# Patient Record
Sex: Female | Born: 1990 | Race: White | Hispanic: No | Marital: Single
Health system: Southern US, Community
[De-identification: ages and names within clinical notes are randomized; demographics above are authoritative.]

## PROBLEM LIST (undated history)

## (undated) DIAGNOSIS — F419 Anxiety disorder, unspecified: Secondary | ICD-10-CM

---

## 2020-03-28 ENCOUNTER — Emergency Department
Admission: EM | Admit: 2020-03-28 | Discharge: 2020-03-28 | Disposition: A | Discharge status: Home / Self Care | Payer: BC Managed Care – PPO | Source: Home / Self Care | Attending: Family Medicine | Admitting: Family Medicine

## 2020-03-28 ENCOUNTER — Other Ambulatory Visit: Payer: Self-pay

## 2020-03-28 ENCOUNTER — Emergency Department (INDEPENDENT_AMBULATORY_CARE_PROVIDER_SITE_OTHER): Payer: BC Managed Care – PPO

## 2020-03-28 ENCOUNTER — Encounter: Payer: Self-pay | Admitting: Emergency Medicine

## 2020-03-28 DIAGNOSIS — S93492A Sprain of other ligament of left ankle, initial encounter: Secondary | ICD-10-CM

## 2020-03-28 DIAGNOSIS — W19XXXA Unspecified fall, initial encounter: Secondary | ICD-10-CM

## 2020-03-28 DIAGNOSIS — M25572 Pain in left ankle and joints of left foot: Secondary | ICD-10-CM

## 2020-03-28 HISTORY — DX: Anxiety disorder, unspecified: F41.9

## 2020-03-28 NOTE — ED Provider Notes (Signed)
[facility]    CSN: [id number] Arrival date & time: 03/28/20  1310      History   Chief Complaint Chief Complaint  Patient presents with  . Ankle Injury    HPI Christina Molina is a 29 y.o. female.   While exercising in a gym yesterday, patient jumped over a low hurdle, twisting her left ankle.  She has had persistent pain and swelling in her lateral ankle.         The history is provided by the patient.  Ankle Injury This is a new problem. The current episode started yesterday. The problem occurs constantly. The problem has not changed since onset.The symptoms are aggravated by walking. Nothing relieves the symptoms. Treatments tried: Tylenol. The treatment provided no relief.    Past Medical History:  Diagnosis Date  . Anxiety     There are no problems to display for this patient.   History reviewed. No pertinent surgical history.  OB History   No obstetric history on file.      Home Medications    Prior to Admission medications   Medication Sig Start Date End Date Taking? Authorizing Provider  EPINEPHrine 0.3 mg/0.3 mL IJ SOAJ injection Inject 0.3 mg into the muscle as needed for anaphylaxis.   Yes [provider]  venlafaxine (EFFEXOR) 75 MG tablet Take 75 mg by mouth 2 (two) times daily.   Yes [provider]    Family History No family history on file.  Social History Social History   Tobacco Use  . Smoking status: Not on file  Substance Use Topics  . Alcohol use: Not on file  . Drug use: Not on file     Allergies   Patient has no known allergies.   Review of Systems Review of Systems  Constitutional: Negative.   Musculoskeletal: Positive for joint swelling.       Left ankle pain     Physical Exam Triage Vital Signs ED Triage Vitals  Enc Vitals Group     BP 03/28/20 1331 133/80     Pulse Rate 03/28/20 1331 93     Resp 03/28/20 1331 16     Temp 03/28/20 1331 98.8 F (37.1 C)     Temp src --       SpO2 03/28/20 1331 98 %     Weight 03/28/20 1332 240 lb (108.9 kg)     Height 03/28/20 1332 5\' 8"  (1.727 m)     Head Circumference --      Peak Flow --      Pain Score 03/28/20 1332 5     Pain Loc --      Pain Edu? --      Excl. in GC? --    No data found.  Updated Vital Signs BP 133/80 (BP Location: Right Wrist)   Pulse 93   Temp 98.8 F (37.1 C)   Resp 16   Ht 5\' 8"  (1.727 m)   Wt 108.9 kg   LMP 03/19/2020 (Exact Date)   SpO2 98%   BMI 36.49 kg/m   Visual Acuity Right Eye Distance:   Left Eye Distance:   Bilateral Distance:    Right Eye Near:   Left Eye Near:    Bilateral Near:     Physical Exam Vitals and nursing note reviewed.  Constitutional:      General: She is not in acute distress. HENT:     Head: Atraumatic.  Eyes:     Pupils: Pupils are  equal, round, and reactive to light.  Cardiovascular:     Rate and Rhythm: Normal rate.  Pulmonary:     Effort: Pulmonary effort is normal.  Musculoskeletal:     Cervical back: Normal range of motion.     Left ankle: Swelling present. No deformity, ecchymosis or lacerations. Tenderness present over the lateral malleolus. No base of 5th metatarsal tenderness. Decreased range of motion.     Left Achilles Tendon: Normal.       Feet:     Comments: Left ankle:  Decreased range of motion.  Tenderness and swelling over the lateral malleolus.  Joint stable.  No tenderness over the base of the fifth metatarsal.  Distal neurovascular function is intact.   Skin:    General: Skin is warm and dry.  Neurological:     Mental Status: She is alert.      UC Treatments / Results  Labs (all labs ordered are listed, but only abnormal results are displayed) Labs Reviewed - No data to display  EKG   Radiology DG Ankle Complete Left  Result Date: 03/28/2020 CLINICAL DATA:  Lateral ankle pain after injury 1 day ago EXAM: LEFT ANKLE COMPLETE - 3+ VIEW COMPARISON:  None. FINDINGS: 3.5 mm curvilinear mineralized density seen on  lateral view projecting dorsal to the level of the metatarsal bases, nonspecific. A small fracture fragment is not excluded. Remaining osseous structures appear intact. Ankle mortise is congruent. Joint spaces are maintained. There is soft tissue swelling about the ankle. IMPRESSION: 1. 3.5 mm curvilinear mineralized density seen on lateral view projecting dorsal to the level of the metatarsal bases, nonspecific. A small fracture fragment is not excluded. Correlate for point tenderness at this location. Dedicated radiographs of the left foot may help to further evaluate. 2. Soft tissue swelling about the ankle. Electronically Signed   By: Davina Poke D.O.   On: 03/28/2020 14:15    Procedures Procedures (including critical care time)  Medications Ordered in UC Medications - No data to display  Initial Impression / Assessment and Plan / UC Course  I have reviewed the triage vital signs and the nursing notes.  Pertinent labs & imaging results that were available during my care of the patient were reviewed by me and considered in my medical decision making (see chart for details).    Dispensed ace wrap and AirCast stirrup splint. Followup with Dr. Aundria Mems ([facility]) if not improving about two weeks.    Final Clinical Impressions(s) / UC Diagnoses   Final diagnoses:  Sprain of anterior talofibular ligament of left ankle, initial encounter     Discharge Instructions     Apply ice pack for 30 minutes every 1 to 2 hours today and tomorrow.  Elevate.  Use crutches for 3 to 5 days.  Wear Ace wrap until swelling decreases.  Wear brace for about 2 to 3 weeks.  Begin range of motion and stretching exercises in about 5 days as per instruction sheet.  May take Tylenol as needed for pain.     ED Prescriptions    None        Kandra Nicolas, MD 03/30/20 2200

## 2020-03-28 NOTE — ED Triage Notes (Signed)
Patient fell off exercise platform yesterday and landed on left ankle at odd position; now slightly edematous; took acetaminophen at 0700. Patient has had both doses covid vaccination.

## 2020-03-28 NOTE — Discharge Instructions (Addendum)
Apply ice pack for 30 minutes every 1 to 2 hours today and tomorrow.  Elevate.  Use crutches for 3 to 5 days.  Wear Ace wrap until swelling decreases.  Wear brace for about 2 to 3 weeks.  Begin range of motion and stretching exercises in about 5 days as per instruction sheet.  May take Tylenol as needed for pain. °

## 2021-09-28 IMAGING — DX DG ANKLE COMPLETE 3+V*L*
3 series · 3 of 3 positions shown · non-contrast
Comparison: None.

CLINICAL DATA: Lateral ankle pain after injury 1 day ago

EXAM:
LEFT ANKLE COMPLETE - 3+ VIEW

[ankle ap (1 of 2)]
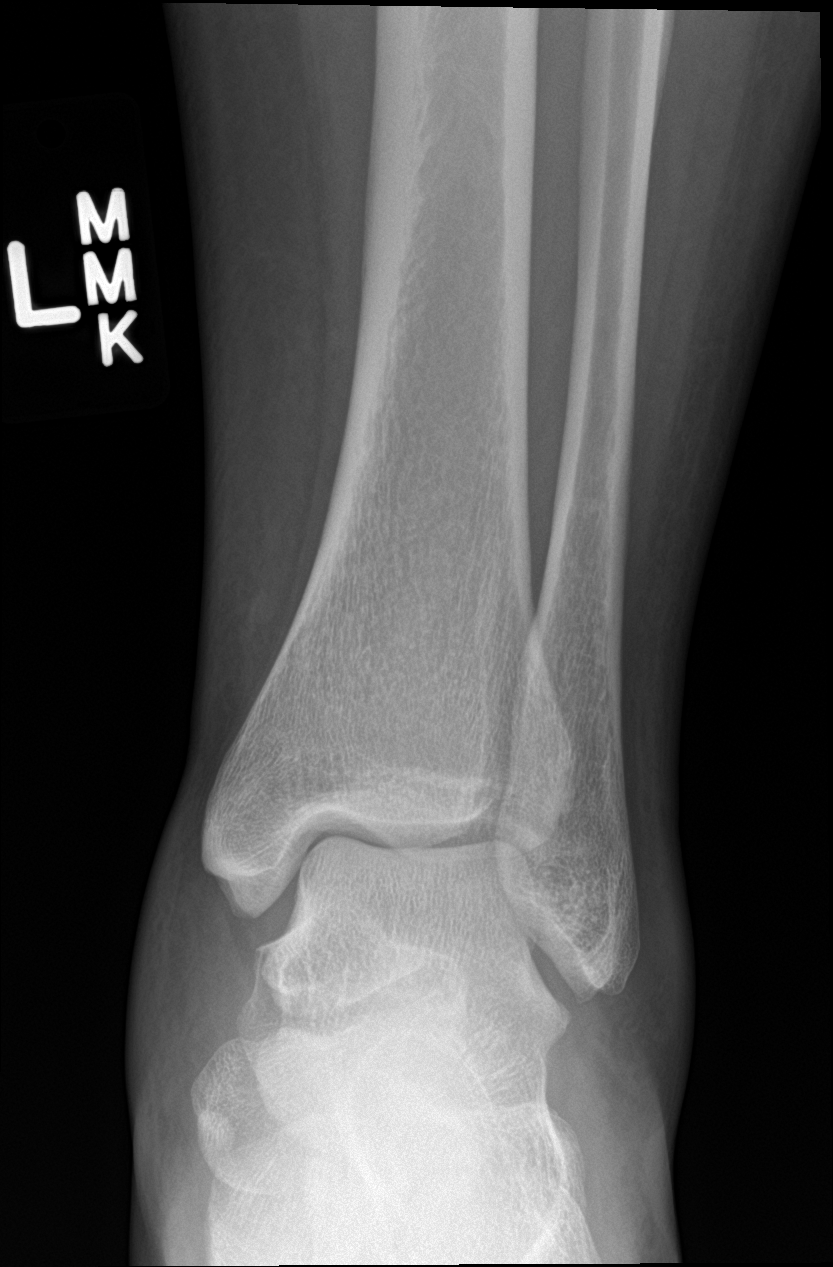

[ankle lat]
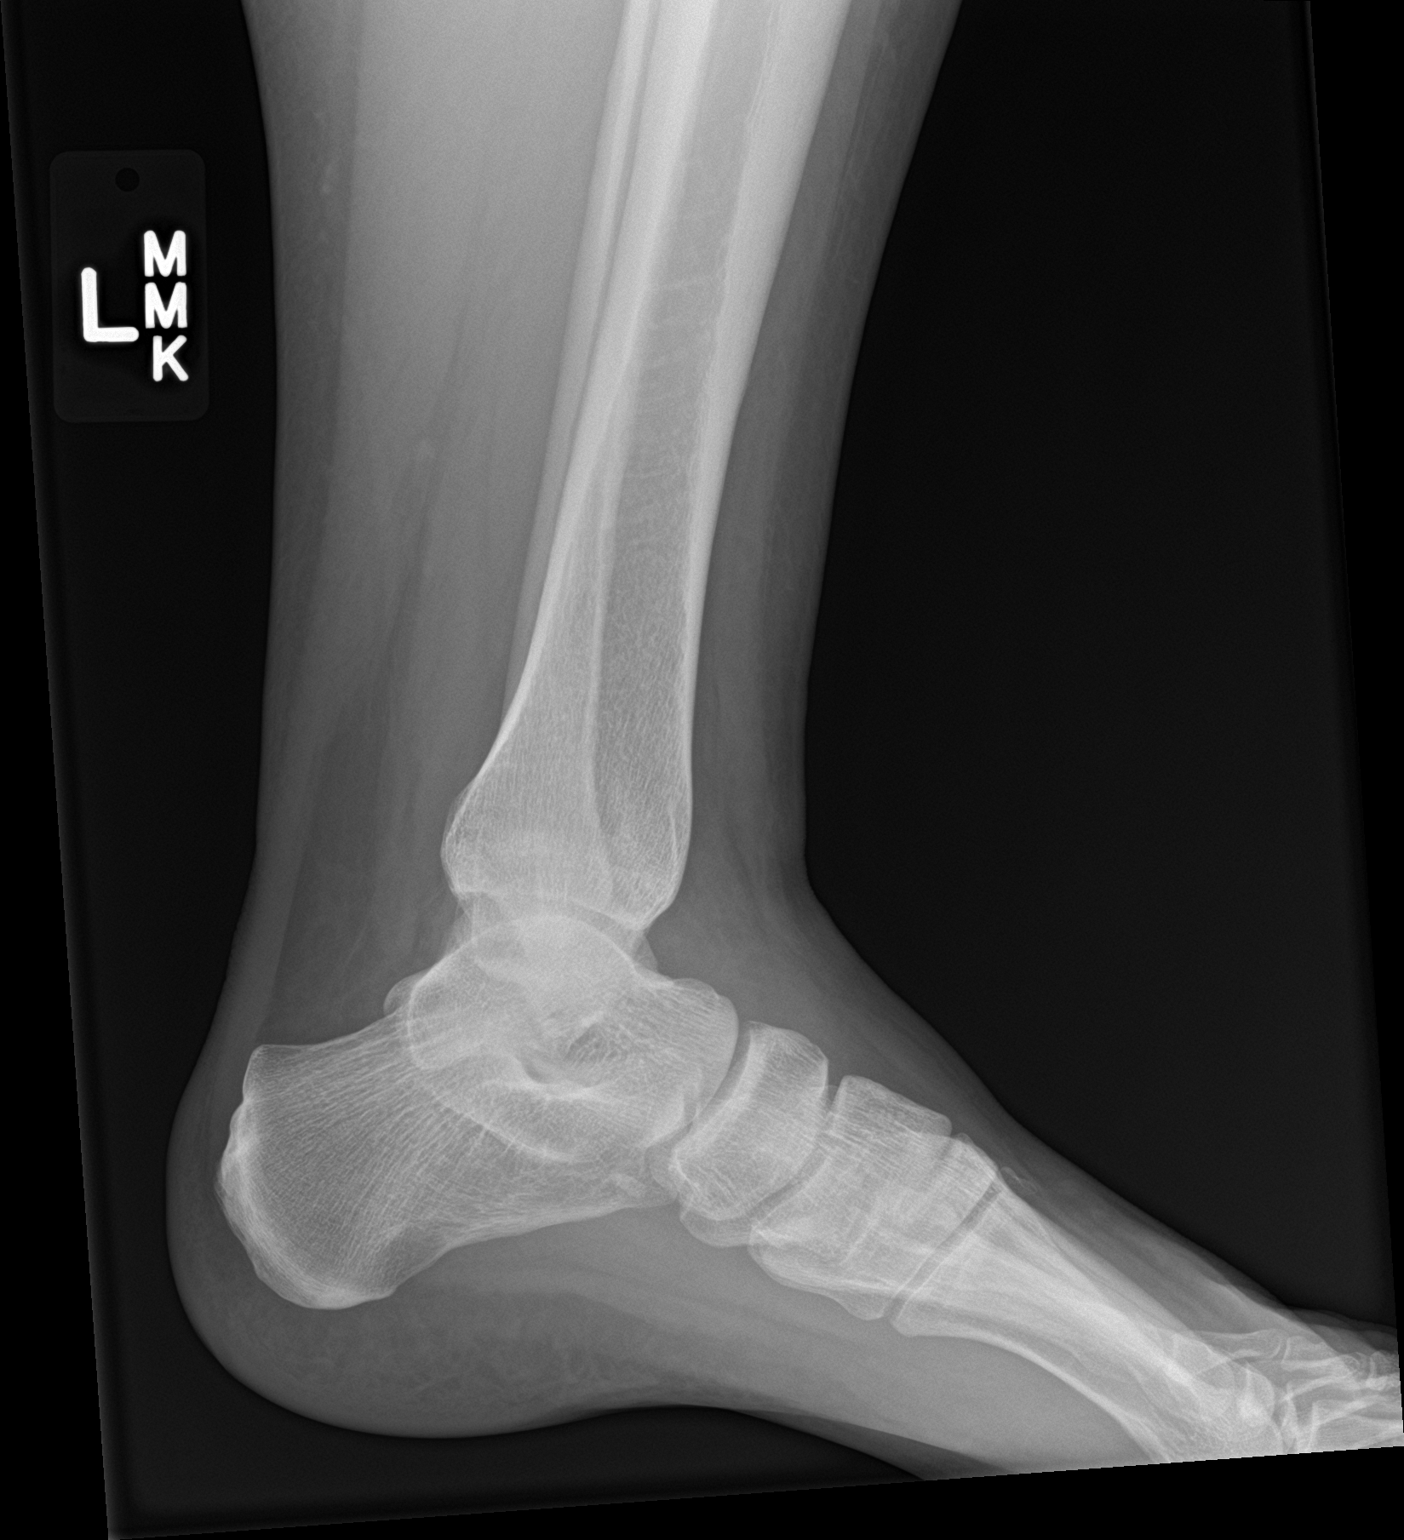

[ankle ap (2 of 2)]
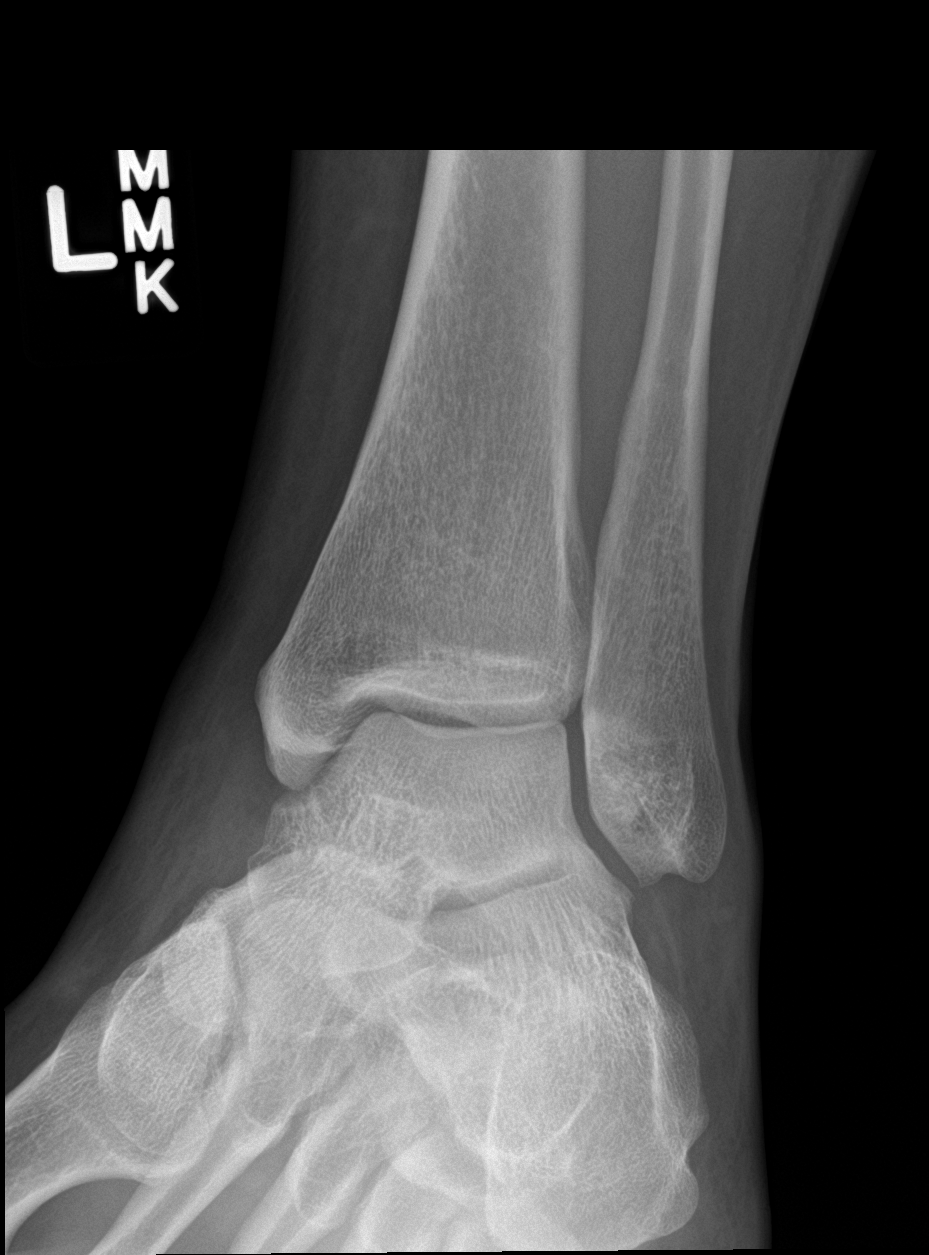

[3 of 3 positions shown; findings below may reference images not displayed]

FINDINGS: 3.5 mm curvilinear mineralized density seen on lateral view
projecting dorsal to the level of the metatarsal bases, nonspecific.
A small fracture fragment is not excluded. Remaining osseous
structures appear intact. Ankle mortise is congruent. Joint spaces
are maintained. There is soft tissue swelling about the ankle.
IMPRESSION: 1. 3.5 mm curvilinear mineralized density seen on lateral view
projecting dorsal to the level of the metatarsal bases, nonspecific.
A small fracture fragment is not excluded. Correlate for point
tenderness at this location. Dedicated radiographs of the left foot
may help to further evaluate.
2. Soft tissue swelling about the ankle.
